# Patient Record
Sex: Female | Born: 1990 | Race: White | Hispanic: No | Marital: Single | Smoking: Never smoker
Health system: Southern US, Community
[De-identification: ages and names within clinical notes are randomized; demographics above are authoritative.]

## PROBLEM LIST (undated history)

## (undated) HISTORY — PX: KNEE ARTHROSCOPY: SUR90

---

## 2016-05-25 ENCOUNTER — Encounter (HOSPITAL_COMMUNITY): Payer: Self-pay

## 2016-05-25 ENCOUNTER — Emergency Department (HOSPITAL_COMMUNITY)
Admission: EM | Admit: 2016-05-25 | Discharge: 2016-05-25 | Disposition: A | Discharge status: Home / Self Care | Payer: Self-pay | Attending: Emergency Medicine | Admitting: Emergency Medicine

## 2016-05-25 ENCOUNTER — Emergency Department (HOSPITAL_COMMUNITY): Payer: Self-pay

## 2016-05-25 DIAGNOSIS — S6991XA Unspecified injury of right wrist, hand and finger(s), initial encounter: Secondary | ICD-10-CM | POA: Insufficient documentation

## 2016-05-25 DIAGNOSIS — Y999 Unspecified external cause status: Secondary | ICD-10-CM | POA: Insufficient documentation

## 2016-05-25 DIAGNOSIS — Y929 Unspecified place or not applicable: Secondary | ICD-10-CM | POA: Insufficient documentation

## 2016-05-25 DIAGNOSIS — W109XXA Fall (on) (from) unspecified stairs and steps, initial encounter: Secondary | ICD-10-CM | POA: Insufficient documentation

## 2016-05-25 DIAGNOSIS — Y9301 Activity, walking, marching and hiking: Secondary | ICD-10-CM | POA: Insufficient documentation

## 2016-05-25 MED ORDER — IBUPROFEN 800 MG PO TABS: 800.0000 mg | ORAL_TABLET | Freq: Three times a day (TID) | ORAL | 0 refills | Status: AC | PRN

## 2016-05-25 MED ORDER — METHOCARBAMOL 500 MG PO TABS: 500.0000 mg | ORAL_TABLET | Freq: Three times a day (TID) | ORAL | 0 refills | Status: AC | PRN

## 2016-05-25 NOTE — Discharge Instructions (Signed)
Read the information below.  Use the prescribed medication as directed.  Please discuss all new medications with your pharmacist.  You may return to the Emergency Department at any time for worsening condition or any new symptoms that concern you.   If you develop uncontrolled pain, weakness or numbness of the extremity, severe discoloration of the skin, or you are unable to move your fingers, return to the ER for a recheck.     Please see the hand specialist or your primary care provider in 1 week for repeat xrays and continued evaluation and treatment.

## 2016-05-25 NOTE — ED Notes (Signed)
Ortho tech called 

## 2016-05-25 NOTE — ED Triage Notes (Signed)
Pt c/o R thumb pain/injury after "falling up stairs" last night pain.  Pain score 6/10.  Pt has not taken anything for pain.  Swelling and bruising noted.

## 2016-05-25 NOTE — ED Provider Notes (Addendum)
[facility]-EMERGENCY DEPT Provider Note   CSN: [id number] Arrival date & time: 05/25/16  1345  By signing my name below, I, Teofilo PodMatthew P. Jamison, attest that this documentation has been prepared under the direction and in the presence of Zauria Dombek, [place]. Electronically Signed: Teofilo PodMatthew P. Jamison, ED Scribe. 05/25/2016. 3:53 PM.    History   Chief Complaint Chief Complaint  Patient presents with  . Thumb Injury    The history is provided by the patient. No language interpreter was used.   HPI Comments:  Tammy Roy is a 26 y.o. female who presents to the Emergency Department s/p right thumb injury that occurred last night. Pt reports that she was walking up the stairs, fell forwards, and braced her fall with her right hand. Pt complains of associated pain, swelling, and bruising to her right thumb, and rates the pain at 6/10. Pt has never seen an orthopedic specialist. No alleviating factors noted. Pt denies other associated symptoms. Denies weakness or numbness.  Denies other injury.    History reviewed. No pertinent past medical history.  There are no active problems to display for this patient.   Past Surgical History:  Procedure Laterality Date  . KNEE ARTHROSCOPY Left     OB History    No data available       Home Medications    Prior to Admission medications   Medication Sig Start Date End Date Taking? Authorizing Provider  ibuprofen (ADVIL,MOTRIN) 800 MG tablet Take 1 tablet (800 mg total) by mouth every 8 (eight) hours as needed for mild pain or moderate pain. 05/25/16   Trixie DredgeEmily Chidi Shirer, PA-C  methocarbamol (ROBAXIN) 500 MG tablet Take 1-2 tablets (500-1,000 mg total) by mouth every 8 (eight) hours as needed (pain). 05/25/16   Trixie DredgeEmily Shontae Rosiles, PA-C    Family History History reviewed. No pertinent family history.  Social History Social History  Substance Use Topics  . Smoking status: Never Smoker  . Smokeless tobacco: Never Used  . Alcohol use Yes     Allergies     Patient has no allergy information on record.   Review of Systems Review of Systems  Constitutional: Negative for fever.  Musculoskeletal: Positive for arthralgias.  Skin: Positive for color change. Negative for wound.  Allergic/Immunologic: Negative for immunocompromised state.  Neurological: Negative for weakness and numbness.  Hematological: Does not bruise/bleed easily.  Psychiatric/Behavioral: Negative for self-injury.     Physical Exam Updated Vital Signs BP 146/98   Pulse 95   Temp 99 F (37.2 C) (Oral)   Resp 18   SpO2 98%   Physical Exam  Constitutional: She appears well-developed and well-nourished. No distress.  HENT:  Head: Normocephalic and atraumatic.  Neck: Neck supple.  Pulmonary/Chest: Effort normal.  Musculoskeletal:  Right hand: TTP throughout first metacarpal with edema and ecchymosis over thenar eminence. +snuff box tenderness.   Neurological: She is alert.  Skin: She is not diaphoretic.  Nursing note and vitals reviewed.    ED Treatments / Results  DIAGNOSTIC STUDIES:  Oxygen Saturation is 99% on RA, normal by my interpretation.    COORDINATION OF CARE:  3:28 PM X-ray ordered. Discussed treatment plan with pt at bedside and pt agreed to plan.   Labs (all labs ordered are listed, but only abnormal results are displayed) Labs Reviewed - No data to display  EKG  EKG Interpretation None       Radiology Dg Finger Thumb Right  Result Date: 05/25/2016 CLINICAL DATA:  Fall yesterday.  Lateral thumb pain.  EXAM: RIGHT THUMB 2+V COMPARISON:  None. FINDINGS: There is no evidence of fracture or dislocation. There is no evidence of arthropathy or other focal bone abnormality. Soft tissues are unremarkable IMPRESSION: Negative right thumb radiographs. Electronically Signed   By: Marin Roberts M.D.   On: 05/25/2016 14:51    Procedures Procedures (including critical care time)  Medications Ordered in ED Medications - No data to  display   Initial Impression / Assessment and Plan / ED Course  I have reviewed the triage vital signs and the nursing notes.  Pertinent labs & imaging results that were available during my care of the patient were reviewed by me and considered in my medical decision making (see chart for details).  Clinical Course as of May 25 2044  Wed May 25, 2016  1606 Pt does not have insurance, declined further xrays despite concerns about scaphoid injury/radial wrist injury.  Will place in thumb spica splint with hand vs PCP follow up (as pt may not be able to afford specialist)  [EW]    Clinical Course User Index [EW] Trixie Dredge, PA-C    Thumb spica splint placed by orthopedic tech.  Afebrile, nontoxic patient with injury to her right hand with mechanical fall up the stairs.   Xray negative.  I advised the patient that she likely needed further imaging to capture all the areas of tenderness including her radial wrist but pt declined.  She does have snuffbox tenderness and she will be placed in a splint. I have recommended hand surgery follow up but pt does not have insurance and thinks she will go back to [place] PCP [place].   D/C home with motrin, robaxin, thumb spica splint, orthopedic follow up.  Discussed result, findings, treatment, and follow up  with patient.  Pt given return precautions.  Pt verbalizes understanding and agrees with plan.      Final Clinical Impressions(s) / ED Diagnoses   Final diagnoses:  Hand injury, right, initial encounter    New Prescriptions Discharge Medication List as of 05/25/2016  4:11 PM    START taking these medications   Details  ibuprofen (ADVIL,MOTRIN) 800 MG tablet Take 1 tablet (800 mg total) by mouth every 8 (eight) hours as needed for mild pain or moderate pain., Starting Wed 05/25/2016, Print    methocarbamol (ROBAXIN) 500 MG tablet Take 1-2 tablets (500-1,000 mg total) by mouth every 8 (eight) hours as needed (pain)., Starting Wed  05/25/2016, Print        I personally performed the services described in this documentation, which was scribed in my presence. The recorded information has been reviewed and is accurate.     Trixie Dredge, PA-C 05/25/16 2048    Benjiman Core, MD 05/26/16 0024    Trixie Dredge, PA-C 06/08/16 1610    Benjiman Core, MD 06/09/16 0700

## 2017-08-03 IMAGING — CR DG FINGER THUMB 2+V*R*
3 series · 3 of 3 positions shown · non-contrast
Comparison: None.

CLINICAL DATA: Fall yesterday.  Lateral thumb pain.

EXAM:
RIGHT THUMB 2+V

[x finger pa right]
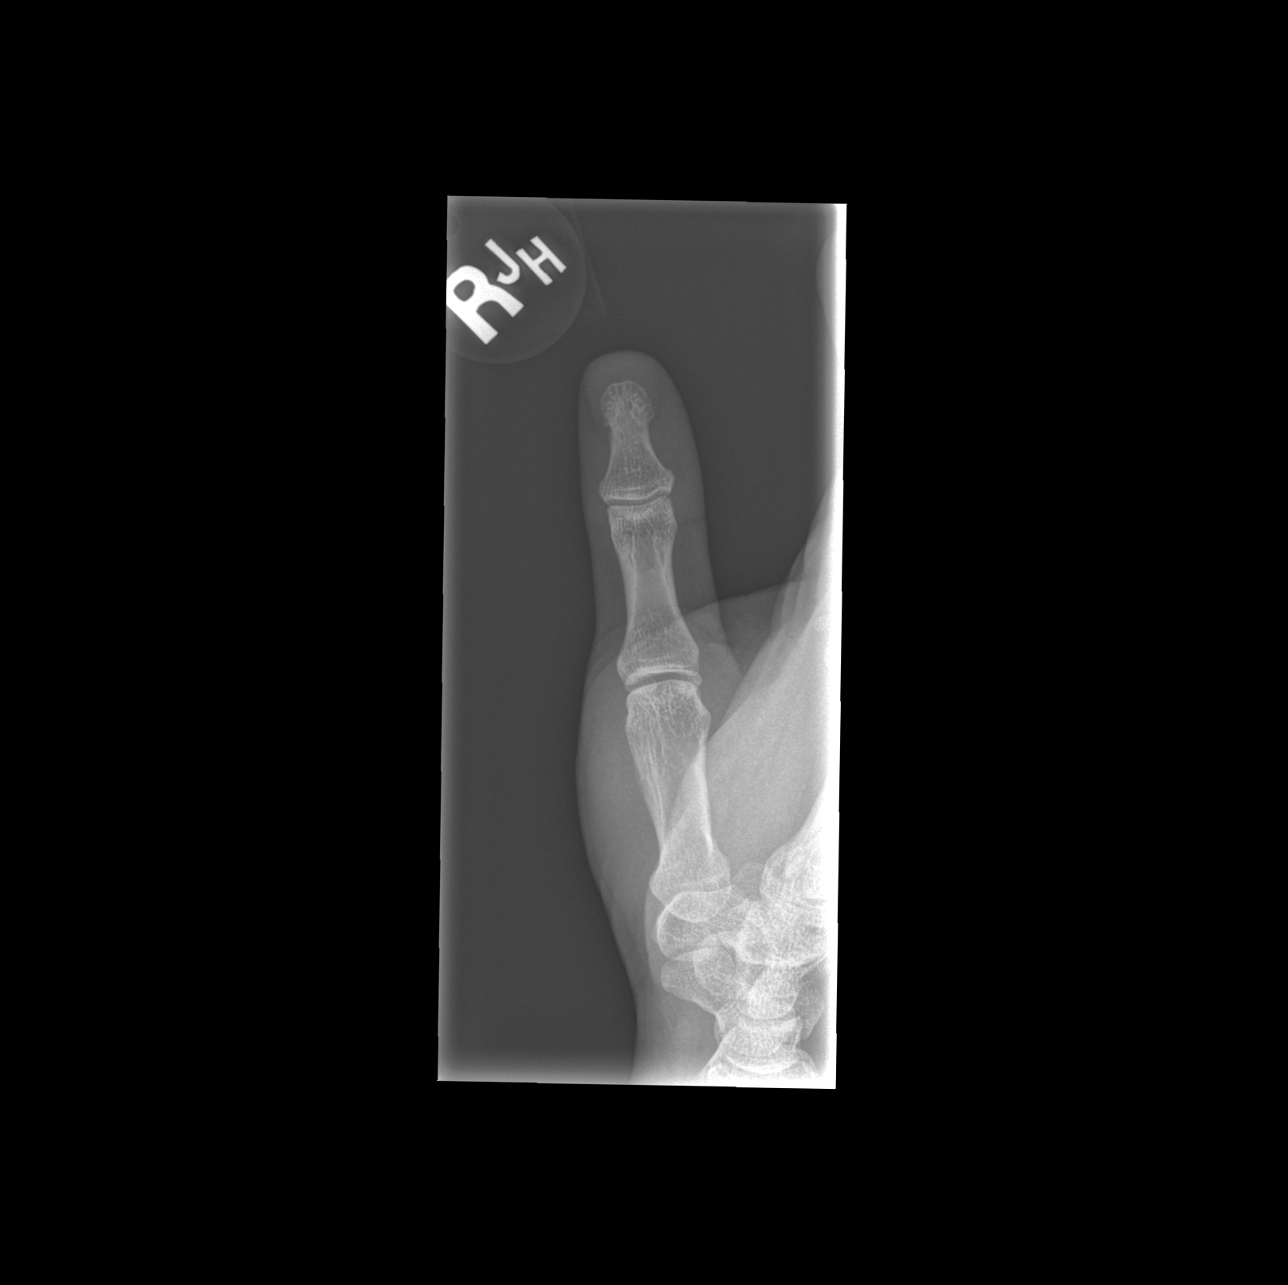

[x finger obl right]
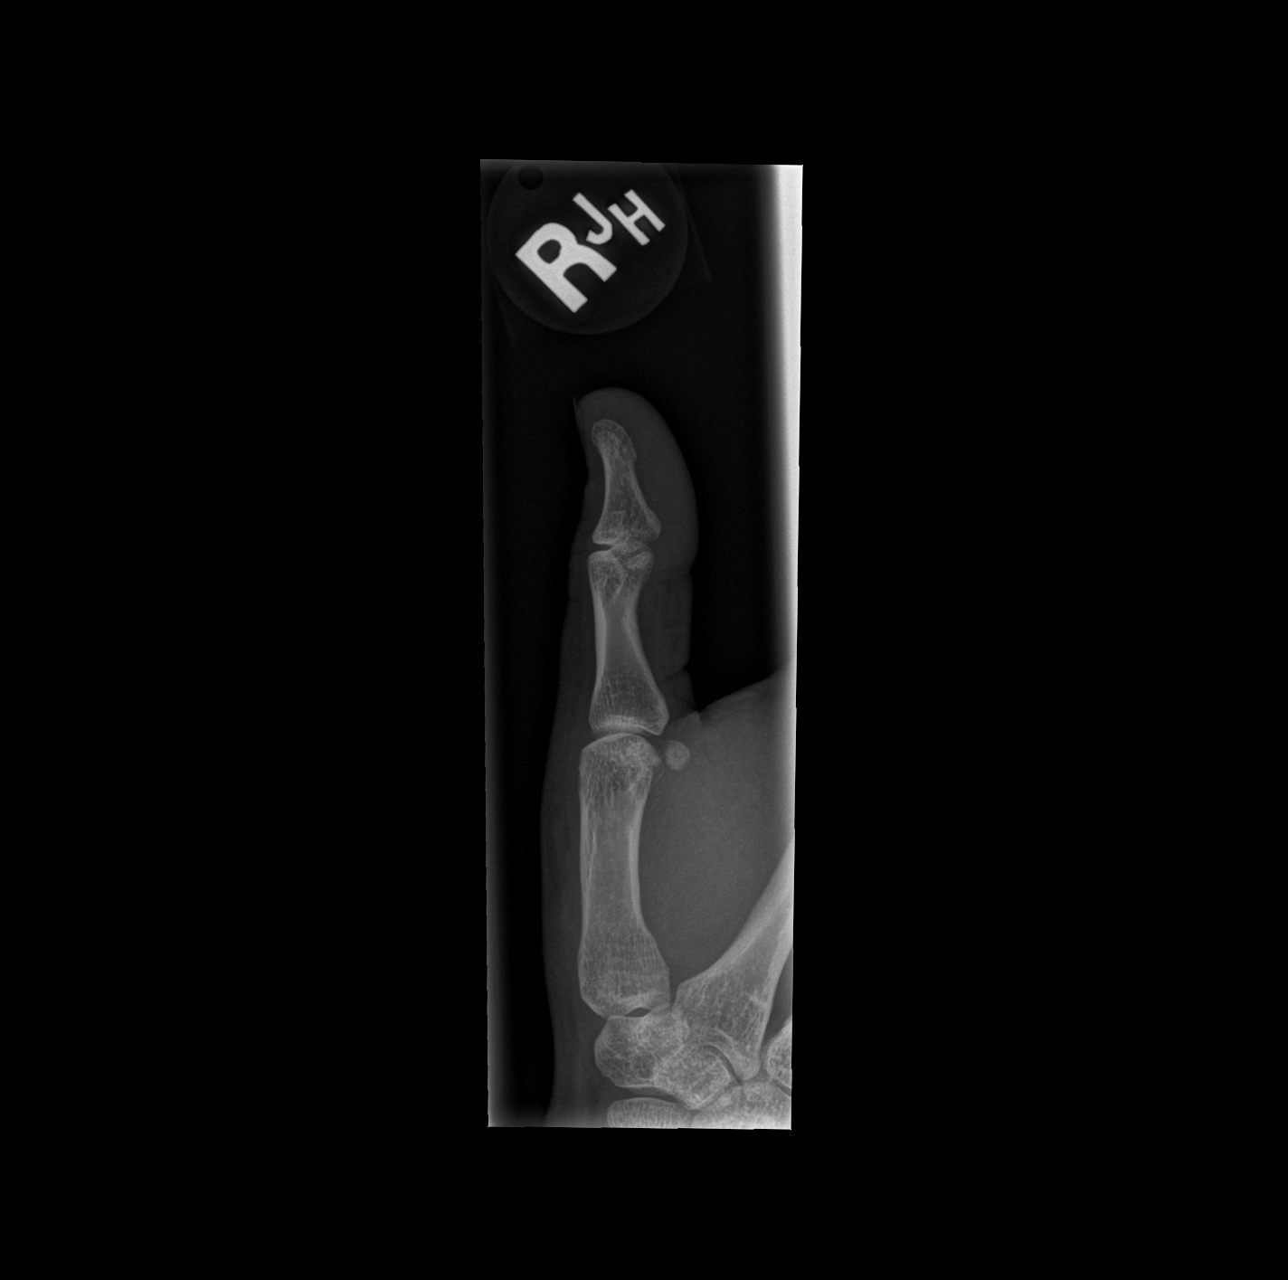

[x finger lat right]
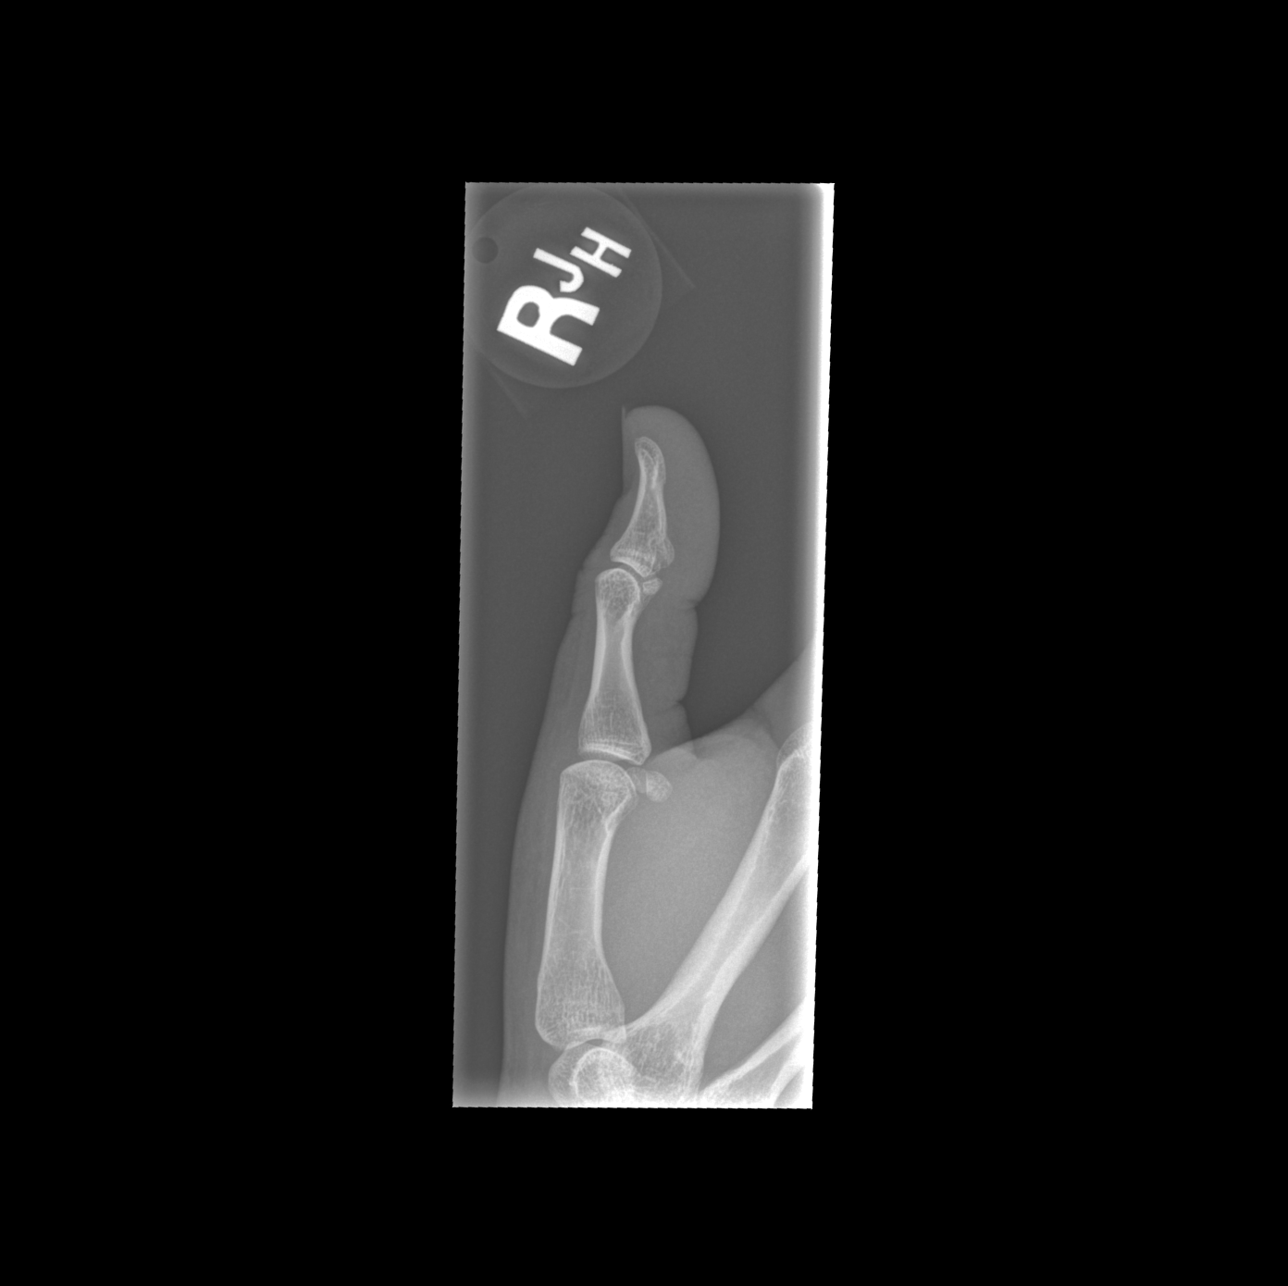

[3 of 3 positions shown; findings below may reference images not displayed]

FINDINGS: There is no evidence of fracture or dislocation. There is no
evidence of arthropathy or other focal bone abnormality. Soft
tissues are unremarkable
IMPRESSION: Negative right thumb radiographs.

## 2018-10-01 DIAGNOSIS — Z3046 Encounter for surveillance of implantable subdermal contraceptive: Secondary | ICD-10-CM | POA: Diagnosis not present

## 2018-10-31 DIAGNOSIS — Z6829 Body mass index (BMI) 29.0-29.9, adult: Secondary | ICD-10-CM | POA: Diagnosis not present

## 2018-10-31 DIAGNOSIS — Z01419 Encounter for gynecological examination (general) (routine) without abnormal findings: Secondary | ICD-10-CM | POA: Diagnosis not present

## 2018-11-26 DIAGNOSIS — L723 Sebaceous cyst: Secondary | ICD-10-CM | POA: Diagnosis not present

## 2019-04-16 DIAGNOSIS — F419 Anxiety disorder, unspecified: Secondary | ICD-10-CM | POA: Diagnosis not present

## 2019-04-16 DIAGNOSIS — Z23 Encounter for immunization: Secondary | ICD-10-CM | POA: Diagnosis not present

## 2019-04-16 DIAGNOSIS — F322 Major depressive disorder, single episode, severe without psychotic features: Secondary | ICD-10-CM | POA: Diagnosis not present
